# Patient Record
Sex: Male | Born: 1965 | Race: White | Hispanic: No | Marital: Married | Smoking: Never smoker
Health system: Southern US, Community
[De-identification: ages and names within clinical notes are randomized; demographics above are authoritative.]

## PROBLEM LIST (undated history)

## (undated) HISTORY — PX: APPENDECTOMY: SHX54

---

## 2009-06-02 ENCOUNTER — Emergency Department (HOSPITAL_COMMUNITY): Admission: EM | Admit: 2009-06-02 | Discharge: 2009-06-02 | Payer: Self-pay | Admitting: Emergency Medicine

## 2011-01-04 ENCOUNTER — Emergency Department (INDEPENDENT_AMBULATORY_CARE_PROVIDER_SITE_OTHER)
Admission: EM | Admit: 2011-01-04 | Discharge: 2011-01-04 | Disposition: A | Discharge status: Home / Self Care | Payer: PRIVATE HEALTH INSURANCE | Source: Home / Self Care | Attending: Family Medicine | Admitting: Family Medicine

## 2011-01-04 ENCOUNTER — Encounter: Payer: Self-pay | Admitting: *Deleted

## 2011-01-04 DIAGNOSIS — S335XXA Sprain of ligaments of lumbar spine, initial encounter: Secondary | ICD-10-CM

## 2011-01-04 DIAGNOSIS — S39012A Strain of muscle, fascia and tendon of lower back, initial encounter: Secondary | ICD-10-CM

## 2011-01-04 MED ORDER — CYCLOBENZAPRINE HCL 5 MG PO TABS: 5.0000 mg | ORAL_TABLET | Freq: Three times a day (TID) | ORAL | Status: AC | PRN

## 2011-01-04 MED ORDER — KETOROLAC TROMETHAMINE 30 MG/ML IJ SOLN
INTRAMUSCULAR | Status: AC
  Filled 2011-01-04: qty 1

## 2011-01-04 MED ORDER — KETOROLAC TROMETHAMINE 30 MG/ML IJ SOLN
30.0000 mg | Freq: Once | INTRAMUSCULAR | Status: AC
  Administered 2011-01-04: 30 mg via INTRAMUSCULAR

## 2011-01-04 MED ORDER — IBUPROFEN 800 MG PO TABS: 800.0000 mg | ORAL_TABLET | Freq: Three times a day (TID) | ORAL | Status: AC

## 2011-01-04 NOTE — ED Provider Notes (Signed)
History     CSN: [id number]  Arrival date & time 01/04/11  4098   First MD Initiated Contact with Patient 01/04/11 1916      No chief complaint on file.   (Consider location/radiation/quality/duration/timing/severity/associated sxs/prior treatment) Patient is a 45 y.o. male presenting with back pain. The history is provided by the patient.  Back Pain  This is a new problem. The current episode started more than 1 week ago. The problem occurs constantly. The problem has not changed since onset.The pain is present in the lumbar spine. The quality of the pain is described as shooting. Radiates to: radiates up left lumbar spine. The pain is moderate. The symptoms are aggravated by certain positions and bending. Pertinent negatives include no numbness, no abdominal pain, no bladder incontinence, no paresthesias, no tingling and no weakness. He has tried nothing for the symptoms. Risk factors: exercises reg, in excellent fitness shape, no prior back issues.    No past medical history on file.  No past surgical history on file.  No family history on file.  History  Substance Use Topics  . Smoking status: Not on file  . Smokeless tobacco: Not on file  . Alcohol Use: Not on file      Review of Systems  Constitutional: Negative.   Gastrointestinal: Negative.  Negative for abdominal pain.  Genitourinary: Negative.  Negative for bladder incontinence.  Musculoskeletal: Positive for back pain. Negative for gait problem.  Neurological: Negative for tingling, weakness, numbness and paresthesias.    Allergies  Review of patient's allergies indicates not on file.  Home Medications  No current outpatient prescriptions on file.  BP 132/90  Pulse 107  Temp 99.2 F (37.3 C)  Resp 20  SpO2 100%  Physical Exam  Nursing note and vitals reviewed. Constitutional: He is oriented to person, place, and time. He appears well-developed and well-nourished.  Abdominal: Soft. Bowel sounds are  normal.  Musculoskeletal: He exhibits tenderness. He exhibits no edema.       Back:  Neurological: He is alert and oriented to person, place, and time. He displays normal reflexes. He exhibits normal muscle tone.  Skin: Skin is warm and dry.    ED Course  Procedures (including critical care time)  Labs Reviewed - No data to display No results found.   No diagnosis found.    MDM          Barkley Bruns, MD 01/04/11 2016

## 2011-01-04 NOTE — ED Notes (Signed)
Pt   Reports   Low  Back  Pain    Lower  Back  Area   X  1  Week   denys  Any  specefic  Injury  Pain is  Worse on movement  And  posistion     denys  Any  Urinary  Symptoms

## 2011-02-01 ENCOUNTER — Other Ambulatory Visit: Payer: Self-pay | Admitting: *Deleted

## 2011-02-01 DIAGNOSIS — M5137 Other intervertebral disc degeneration, lumbosacral region: Secondary | ICD-10-CM

## 2011-02-01 DIAGNOSIS — M5030 Other cervical disc degeneration, unspecified cervical region: Secondary | ICD-10-CM

## 2011-02-11 ENCOUNTER — Inpatient Hospital Stay
Admission: RE | Admit: 2011-02-11 | Payer: PRIVATE HEALTH INSURANCE | Source: Ambulatory Visit | Attending: *Deleted | Admitting: *Deleted

## 2011-02-21 ENCOUNTER — Ambulatory Visit
Admission: RE | Admit: 2011-02-21 | Discharge: 2011-02-21 | Disposition: A | Discharge status: Home / Self Care | Payer: PRIVATE HEALTH INSURANCE | Source: Ambulatory Visit | Attending: *Deleted | Admitting: *Deleted

## 2011-02-21 DIAGNOSIS — M5137 Other intervertebral disc degeneration, lumbosacral region: Secondary | ICD-10-CM

## 2011-02-21 DIAGNOSIS — M5030 Other cervical disc degeneration, unspecified cervical region: Secondary | ICD-10-CM

## 2019-09-08 ENCOUNTER — Emergency Department (HOSPITAL_BASED_OUTPATIENT_CLINIC_OR_DEPARTMENT_OTHER)
Admission: EM | Admit: 2019-09-08 | Discharge: 2019-09-08 | Disposition: A | Discharge status: Home / Self Care | Payer: PRIVATE HEALTH INSURANCE | Attending: Emergency Medicine | Admitting: Emergency Medicine

## 2019-09-08 ENCOUNTER — Emergency Department (HOSPITAL_BASED_OUTPATIENT_CLINIC_OR_DEPARTMENT_OTHER): Payer: PRIVATE HEALTH INSURANCE

## 2019-09-08 ENCOUNTER — Encounter (HOSPITAL_BASED_OUTPATIENT_CLINIC_OR_DEPARTMENT_OTHER): Payer: Self-pay | Admitting: Emergency Medicine

## 2019-09-08 ENCOUNTER — Other Ambulatory Visit: Payer: Self-pay

## 2019-09-08 DIAGNOSIS — R0789 Other chest pain: Secondary | ICD-10-CM | POA: Insufficient documentation

## 2019-09-08 LAB — BASIC METABOLIC PANEL
Anion gap: 9 (ref 5–15)
BUN: 13 mg/dL (ref 6–20)
CO2: 26 mmol/L (ref 22–32)
Calcium: 9.5 mg/dL (ref 8.9–10.3)
Chloride: 99 mmol/L (ref 98–111)
Creatinine, Ser: 1.15 mg/dL (ref 0.61–1.24)
GFR calc Af Amer: >60 mL/min (ref >60)
GFR calc non Af Amer: >60 mL/min (ref >60)
Glucose, Bld: 120 mg/dL — ABNORMAL HIGH (ref 70–99)
Potassium: 4.2 mmol/L (ref 3.5–5.1)
Sodium: 134 mmol/L — ABNORMAL LOW (ref 135–145)

## 2019-09-08 LAB — CBC
HCT: 53.8 % — ABNORMAL HIGH (ref 39.0–52.0)
Hemoglobin: 18.3 g/dL — ABNORMAL HIGH (ref 13.0–17.0)
MCH: 29.8 pg (ref 26.0–34.0)
MCHC: 34 g/dL (ref 30.0–36.0)
MCV: 87.5 fL (ref 80.0–100.0)
Platelets: 251 10*3/uL (ref 150–400)
RBC: 6.15 MIL/uL — ABNORMAL HIGH (ref 4.22–5.81)
RDW: 12.4 % (ref 11.5–15.5)
WBC: 13.7 10*3/uL — ABNORMAL HIGH (ref 4.0–10.5)
nRBC: 0 % (ref 0.0–0.2)

## 2019-09-08 LAB — TROPONIN I (HIGH SENSITIVITY)
Troponin I (High Sensitivity): 5 ng/L (ref <18)
Troponin I (High Sensitivity): 6 ng/L (ref <18)

## 2019-09-08 LAB — D-DIMER, QUANTITATIVE: D-Dimer, Quant: 0.79 ug/mL-FEU — ABNORMAL HIGH (ref 0.00–0.50)

## 2019-09-08 MED ORDER — ALUM & MAG HYDROXIDE-SIMETH 200-200-20 MG/5ML PO SUSP
30.0000 mL | Freq: Once | ORAL | Status: AC
  Administered 2019-09-08: 30 mL via ORAL
  Filled 2019-09-08: qty 30

## 2019-09-08 MED ORDER — PANTOPRAZOLE SODIUM 40 MG IV SOLR
40.0000 mg | Freq: Once | INTRAVENOUS | Status: AC
Start: 2019-09-08 — End: 2019-09-08
  Administered 2019-09-08: 40 mg via INTRAVENOUS
  Filled 2019-09-08: qty 40

## 2019-09-08 MED ORDER — FAMOTIDINE IN NACL 20-0.9 MG/50ML-% IV SOLN
20.0000 mg | Freq: Once | INTRAVENOUS | Status: AC
  Administered 2019-09-08: 20 mg via INTRAVENOUS
  Filled 2019-09-08: qty 50

## 2019-09-08 MED ORDER — PANTOPRAZOLE SODIUM 20 MG PO TBEC: 20.0000 mg | DELAYED_RELEASE_TABLET | Freq: Every day | ORAL | 0 refills | Status: AC

## 2019-09-08 MED ORDER — FAMOTIDINE 20 MG PO TABS: 20.0000 mg | ORAL_TABLET | Freq: Every day | ORAL | 0 refills | Status: AC

## 2019-09-08 MED ORDER — SODIUM CHLORIDE 0.9 % IV BOLUS
1000.0000 mL | Freq: Once | INTRAVENOUS | Status: AC
  Administered 2019-09-08: 1000 mL via INTRAVENOUS

## 2019-09-08 MED ORDER — SUCRALFATE 1 GM/10ML PO SUSP: 1.0000 g | Freq: Three times a day (TID) | ORAL | 0 refills | Status: AC

## 2019-09-08 MED ORDER — IOHEXOL 350 MG/ML SOLN
100.0000 mL | Freq: Once | INTRAVENOUS | Status: AC | PRN
  Administered 2019-09-08: 89 mL via INTRAVENOUS

## 2019-09-08 MED ORDER — LIDOCAINE VISCOUS HCL 2 % MT SOLN
15.0000 mL | Freq: Once | OROMUCOSAL | Status: AC
  Administered 2019-09-08: 15 mL via ORAL
  Filled 2019-09-08: qty 15

## 2019-09-08 NOTE — ED Provider Notes (Signed)
[facility] EMERGENCY DEPARTMENT Provider Note   CSN: [id number] Arrival date & time: 09/08/19  0551     History Chief Complaint  Patient presents with  . Chest Pain    Levi Clark is a 54 y.o. male history of chronic back pain on Suboxone.  Otherwise healthy no other daily medication use.  Patient presents today for chest pain onset 4 AM while sleeping, woke him up from bed.  Described as a knot in the center of his chest moderate intensity waxes and wanes no clear inciting factor, no radiation of pain.  Pain occasionally worsened with deep inspiration which causes shortness of breath, at other times no shortness of breath associated.  Patient denies similar pain in the past, pain not worsened with exertion.  Patient reports that he had a long evening yesterday with his wife, they went to a class, he did not drink much water.  He got home around 1030-11 PM and 8 large amount of pizza before going directly to bed.  He reports this is not his normal routine and he never eats that late. He took one Omeprazole today without relief of symptoms.  Patient denies fever/chill, fall/injury, headache, vision changes, neck pain, cough/hemoptysis, abdominal pain, nausea/vomiting, diaphoresis, extremity swelling/color change, numbness/weakness, tingling or any additional concerns.  Patient reports that his father who is a heavy smoker had a stent placed in his mid to late 25s.  Patient denies personal history of heart disease.  He denies history of hypertension, high cholesterol, diabetes, history of smoking, CVA/TIA or PAD.  Additionally patient denies history of blood clot, recent immobilization/surgery, hemoptysis, cancer or hormone use.  HPI     History reviewed. No pertinent past medical history.  There are no problems to display for this patient.   Past Surgical History:  Procedure Laterality Date  . APPENDECTOMY         Family History  Problem Relation Age of Onset   . Emphysema Mother   . CAD Father   . Leukemia Father   . Lung cancer Father     Social History   Tobacco Use  . Smoking status: Never Smoker  . Smokeless tobacco: Never Used  Vaping Use  . Vaping Use: Never used  Substance Use Topics  . Alcohol use: Never  . Drug use: Never    Home Medications Prior to Admission medications   Medication Sig Start Date End Date Taking? Authorizing Provider  aspirin 325 MG tablet Take 325 mg by mouth daily.      [provider]  famotidine (PEPCID) 20 MG tablet Take 1 tablet (20 mg total) by mouth daily. 09/08/19   Harlene Salts A, PA-C  pantoprazole (PROTONIX) 20 MG tablet Take 1 tablet (20 mg total) by mouth daily. 09/08/19   Harlene Salts A, PA-C  sucralfate (CARAFATE) 1 GM/10ML suspension Take 10 mLs (1 g total) by mouth 4 (four) times daily -  with meals and at bedtime. 09/08/19   Bill Salinas, PA-C    Allergies    Patient has no known allergies.  Review of Systems   Review of Systems Ten systems are reviewed and are negative for acute change except as noted in the HPI  Physical Exam Updated Vital Signs BP 140/80   Pulse 92   Temp 98.4 F (36.9 C) (Oral)   Resp 13   Ht 6\' 2"  (1.88 m)   Wt 111.1 kg   SpO2 98%   BMI 31.46 kg/m   Physical Exam Constitutional:  General: He is not in acute distress.    Appearance: Normal appearance. He is well-developed. He is not ill-appearing or diaphoretic.  HENT:     Head: Normocephalic and atraumatic.  Eyes:     General: Vision grossly intact. Gaze aligned appropriately.     Pupils: Pupils are equal, round, and reactive to light.  Neck:     Trachea: Trachea and phonation normal.  Cardiovascular:     Rate and Rhythm: Normal rate and regular rhythm.     Pulses:          Radial pulses are 2+ on the right side and 2+ on the left side.       Posterior tibial pulses are 2+ on the right side and 2+ on the left side.  Pulmonary:     Effort: Pulmonary effort is normal. No  respiratory distress.     Breath sounds: Normal breath sounds.  Abdominal:     General: There is no distension.     Palpations: Abdomen is soft.     Tenderness: There is no abdominal tenderness. There is no guarding or rebound.  Musculoskeletal:        General: Normal range of motion.     Cervical back: Normal range of motion.     Right lower leg: No tenderness. No edema.     Left lower leg: No tenderness. No edema.  Skin:    General: Skin is warm and dry.  Neurological:     Mental Status: He is alert.     GCS: GCS eye subscore is 4. GCS verbal subscore is 5. GCS motor subscore is 6.     Comments: Speech is clear and goal oriented, follows commands Major Cranial nerves without deficit, no facial droop Moves extremities without ataxia, coordination intact  Psychiatric:        Behavior: Behavior normal.     ED Results / Procedures / Treatments   Labs (all labs ordered are listed, but only abnormal results are displayed) Labs Reviewed  BASIC METABOLIC PANEL - Abnormal; Notable for the following components:      Result Value   Sodium 134 (*)    Glucose, Bld 120 (*)    All other components within normal limits  CBC - Abnormal; Notable for the following components:   WBC 13.7 (*)    RBC 6.15 (*)    Hemoglobin 18.3 (*)    HCT 53.8 (*)    All other components within normal limits  D-DIMER, QUANTITATIVE (NOT AT [facility]) - Abnormal; Notable for the following components:   D-Dimer, Quant 0.79 (*)    All other components within normal limits  TROPONIN I (HIGH SENSITIVITY)  TROPONIN I (HIGH SENSITIVITY)    EKG EKG Interpretation  Date/Time:  Saturday September 08 2019 06:30:52 EDT Ventricular Rate:  109 PR Interval:  130 QRS Duration: 94 QT Interval:  320 QTC Calculation: 430 R Axis:   67 Text Interpretation: Sinus tachycardia Otherwise normal ECG No previous ECGs available Confirmed by Paula LibraMolpus, John ([id number]) on 09/08/2019 6:51:42 AM   Radiology DG Chest 2 View  Result Date:  09/08/2019 CLINICAL DATA:  Chest pain EXAM: CHEST - 2 VIEW COMPARISON:  None. FINDINGS: Heart size and mediastinal contours are within normal limits. Lungs are clear. No pleural effusion or pneumothorax is seen. Mild degenerative spondylosis of the thoracic spine. No acute or suspicious osseous finding. IMPRESSION: No active cardiopulmonary disease. No evidence of pneumonia or pulmonary edema. Electronically Signed   By: Anne NgStan  Maynard M.D.  On: 09/08/2019 07:08   CT Angio Chest PE W and/or Wo Contrast  Addendum Date: 09/08/2019   ADDENDUM REPORT: 09/08/2019 14:29 ADDENDUM: Ordering physician tells me that patient recently ingested a GI cocktail with Maalox containing aluminum hydroxide. This corresponds to the hyperdense material seen in the stomach. Electronically Signed   By: Bary Richard M.D.   On: 09/08/2019 14:29   Result Date: 09/08/2019 CLINICAL DATA:  Mid chest pain starting 12 hours ago. Elevated D-dimer. EXAM: CT ANGIOGRAPHY CHEST WITH CONTRAST TECHNIQUE: Multidetector CT imaging of the chest was performed using the standard protocol during bolus administration of intravenous contrast. Multiplanar CT image reconstructions and MIPs were obtained to evaluate the vascular anatomy. CONTRAST:  57mL OMNIPAQUE IOHEXOL 350 MG/ML SOLN COMPARISON:  None. FINDINGS: Cardiovascular: Suboptimal contrast bolus timing slightly limits characterization of the bilateral pulmonary artery branches but there is no pulmonary embolism identified within the main, lobar or central segmental pulmonary arteries bilaterally. No thoracic aortic aneurysm or evidence of aortic dissection. Heart size is within normal limits. No pericardial effusion. Focal coronary artery calcifications near the junction of the LAD and LEFT circumflex coronary arteries. Mediastinum/Nodes: No mass or enlarged lymph nodes are seen within the mediastinum or perihilar regions. Esophagus is unremarkable. Trachea and central bronchi are unremarkable.  Lungs/Pleura: Lungs are clear.  No pleural effusion or pneumothorax. Upper Abdomen: Limited images of the upper abdomen are unremarkable. Hyperdense material is present within the gastric fundus/cardia, of uncertain etiology. Musculoskeletal: Mild degenerative spurring within the kyphotic thoracic spine. No acute appearing osseous abnormality. Review of the MIP images confirms the above findings. IMPRESSION: 1. No acute findings. No pulmonary embolism seen, with mild study limitations detailed above. Lungs are clear. 2. Coronary artery calcification. 3. Hyperdense material within the gastric fundus/cardia, of uncertain etiology, presumably due to recently ingested material, but active hemorrhage within the stomach cannot be excluded. Has patient recently ingested Pepto-Bismol or other dense material? Electronically Signed: By: Bary Richard M.D. On: 09/08/2019 13:29    Procedures Procedures (including critical care time)  Medications Ordered in ED Medications  sodium chloride 0.9 % bolus 1,000 mL ( Intravenous Stopped 09/08/19 1407)  pantoprazole (PROTONIX) injection 40 mg (40 mg Intravenous Given 09/08/19 1248)  famotidine (PEPCID) IVPB 20 mg premix ( Intravenous Stopped 09/08/19 1350)  alum & mag hydroxide-simeth (MAALOX/MYLANTA) 200-200-20 MG/5ML suspension 30 mL (30 mLs Oral Given 09/08/19 1241)    And  lidocaine (XYLOCAINE) 2 % viscous mouth solution 15 mL (15 mLs Oral Given 09/08/19 1241)  iohexol (OMNIPAQUE) 350 MG/ML injection 100 mL (89 mLs Intravenous Contrast Given 09/08/19 1258)    ED Course  I have reviewed the triage vital signs and the nursing notes.  Pertinent labs & imaging results that were available during my care of the patient were reviewed by me and considered in my medical decision making (see chart for details).  Clinical Course as of Sep 08 1439  Sat Sep 08, 2019  1341 Dr. Linde Gillis   [BM]    Clinical Course User Index [BM] Elizabeth Palau   MDM  Rules/Calculators/A&P                          Additional history obtained from: 1. Nursing notes from this visit. 2. Family, wife at bedside. 3. Electronic Medical Record. --------------------------- I ordered, reviewed and interpreted labs which include: CBC shows leukocystosis of 13.7 and elevated hemoglobin of 18.3. Possible hemoconcentration from dehydration last night?  Patient given  fluid bolus.  Will encourage patient to recheck CBC at PCPs office.  He has no symptoms to suggest infection. BMP shows no emergent electrolyte derangement, AKI or gap. Initial and delta high-sensitivity troponins within normal limits 5->6 D-dimer elevated at 0.79, CT angio chest ordered.  EKG: Sinus tachycardia Otherwise normal ECG No previous ECGs available Confirmed by Molpus, John ([id number]) on 09/08/2019 6:51:42 AM  CXR:  IMPRESSION:  No active cardiopulmonary disease. No evidence of pneumonia or  pulmonary edema.   CTA Chest PE Study:  IMPRESSION:  1. No acute findings. No pulmonary embolism seen, with mild study  limitations detailed above. Lungs are clear.  2. Coronary artery calcification.  3. Hyperdense material within the gastric fundus/cardia, of  uncertain etiology, presumably due to recently ingested material,  but active hemorrhage within the stomach cannot be excluded. Has  patient recently ingested Pepto-Bismol or other dense material?   1:41 PM: Spoke with radiologist Dr. Linde Gillis regarding imaging above and case.  Patient had received GI cocktail around 12:41 PM, just prior to undergoing CT scanning.  Dr. Linde Gillis advises that hyperdense material seen is adequately explained by GI cocktail.  Patient also has no left upper quadrant pain, low suspicion for gastric hemorrhage at this time.  Suspect patient symptoms today secondary to GERD and eating the pizza late last night.  Will start patient on Protonix, Pepcid, Carafate and referred to GI and PCP for follow-up. Heart score less than  4.  Doubt ACS, PE, dissection, pneumonia, anemia or other emergent cardiopulmonary etiologies at this time.  Additionally no abdominal pain to suggest cholecystitis, perforation, SBO, pancreatitis or other emergent abdominal pelvic etiologies.  We discussed all incidental findings including coronary artery calcifications and hyperdense materials in the stomach and patient stated understanding.  He was given referral to cardiology and gastroenterology for follow-ups.  Patient is going to start taking 81 mg baby aspirin daily.   At this time there does not appear to be any evidence of an acute emergency medical condition and the patient appears stable for discharge with appropriate outpatient follow up. Diagnosis was discussed with patient who verbalizes understanding of care plan and is agreeable to discharge. I have discussed return precautions with patient and who verbalizes understanding. Patient encouraged to follow-up with their PCP, GI, cardiology. All questions answered.  Patient's case discussed with Dr. Dalene Seltzer who agrees with plan to discharge with above medications and outpatient cardiology/GI follow-ups.  Note: Portions of this report may have been transcribed using voice recognition software. Every effort was made to ensure accuracy; however, inadvertent computerized transcription errors may still be present. Final Clinical Impression(s) / ED Diagnoses Final diagnoses:  Atypical chest pain    Rx / DC Orders ED Discharge Orders         Ordered    pantoprazole (PROTONIX) 20 MG tablet  Daily        09/08/19 1439    famotidine (PEPCID) 20 MG tablet  Daily        09/08/19 1439    sucralfate (CARAFATE) 1 GM/10ML suspension  3 times daily with meals & bedtime        09/08/19 1439    Ambulatory referral to Cardiology        09/08/19 1439           Elizabeth Palau 09/08/19 1442    Alvira Monday, MD 09/10/19 1036

## 2019-09-08 NOTE — ED Notes (Signed)
Patient transported to CT 

## 2019-09-08 NOTE — Discharge Instructions (Addendum)
At this time there does not appear to be the presence of an emergent medical condition, however there is always the potential for conditions to change. Please read and follow the below instructions.  Please return to the Emergency Department immediately for any new or worsening symptoms. Please be sure to follow up with your Primary Care Provider within one week regarding your visit today; please call their office to schedule an appointment even if you are feeling better for a follow-up visit. Your CT scan showed calcifications of your coronary arteries.  Please follow-up with the cardiologist on your discharge paperwork for further evaluation.  Please begin taking a baby aspirin 81 mg daily to help lower your risk of future heart disease. There is a possibility that your symptoms today are related to heartburn.  Please take the medications Protonix, Pepcid and Carafate as prescribed.  Protonix will replace your medication omeprazole.  Please avoid spicy foods and eating late at night.  Please drink plenty water and get plenty of rest.  You have also been given a referral to Dr. Bosie Clos, a gastroenterologist for further evaluation regarding your pain today. Please have your primary care doctor recheck your blood counts at your follow-up appointment, your white blood cell count and your hemoglobin counts were high today this could be due to dehydration but recheck at your primary care doctor is advised to ensure improvement.  Get help right away if: Your chest pain is worse. You have a cough that gets worse, or you cough up blood. You have very bad (severe) pain in your belly (abdomen). You pass out (faint). You have either of these for no clear reason: Sudden chest discomfort. Sudden discomfort in your arms, back, neck, or jaw. You have shortness of breath at any time. You suddenly start to sweat, or your skin gets clammy. You feel sick to your stomach (nauseous). You throw up (vomit). You  suddenly feel lightheaded or dizzy. You feel very weak or tired. Your heart starts to beat fast, or it feels like it is skipping beats. You have any new/concerning or worsening of symptoms These symptoms may be an emergency. Do not wait to see if the symptoms will go away. Get medical help right away. Call your local emergency services (911 in the [place] Do not drive yourself to the hospital.  Please read the additional information packets attached to your discharge summary.  Do not take your medicine if  develop an itchy rash, swelling in your mouth or lips, or difficulty breathing; call 911 and seek immediate emergency medical attention if this occurs.  You may review your lab tests and imaging results in their entirety on your MyChart account.  Please discuss all results of fully with your primary care provider and other specialist at your follow-up visit.  Note: Portions of this text may have been transcribed using voice recognition software. Every effort was made to ensure accuracy; however, inadvertent computerized transcription errors may still be present.

## 2019-09-08 NOTE — ED Triage Notes (Signed)
Pt states he woke up this morning about 4 am with chest pain  Pt states the pain has progressively gotten worse  Pt states he feels like he has a knot in his chest  Pt states he feels short of breath   Pt states he ate frozen pizza last night prior to going to bed

## 2019-09-16 NOTE — Progress Notes (Deleted)
Cardiology Office Note:   Date:  09/16/2019  NAME:  Levi Clark    MRN: [id number] DOB:  Apr 29, 1965   PCP:  Patient, No Pcp Per  Cardiologist:  No primary care provider on file.  Electrophysiologist:  None   Referring MD: Bill Salinas, PA-C   No chief complaint on file. ***  History of Present Illness:   Levi Clark is a 54 y.o. male with a hx of chronic pain on suboxone who is being seen today for the evaluation of chest pain at the request of Bill Salinas, PA-C. Evaluated in the ER 9/4 for atypical CP. Troponin negative x 2. Minimal coronary calcifications noted on that scan which I personally reviewed.   Past Medical History: No past medical history on file.  Past Surgical History: Past Surgical History:  Procedure Laterality Date  . APPENDECTOMY      Current Medications: No outpatient medications have been marked as taking for the 09/18/19 encounter (Appointment) with O'Neal, Ronnald Ramp, MD.     Allergies:    Patient has no known allergies.   Social History: Social History   Socioeconomic History  . Marital status: Married    Spouse name: Not on file  . Number of children: Not on file  . Years of education: Not on file  . Highest education level: Not on file  Occupational History  . Not on file  Tobacco Use  . Smoking status: Never Smoker  . Smokeless tobacco: Never Used  Vaping Use  . Vaping Use: Never used  Substance and Sexual Activity  . Alcohol use: Never  . Drug use: Never  . Sexual activity: Not on file  Other Topics Concern  . Not on file  Social History Narrative  . Not on file   Social Determinants of Health   Financial Resource Strain:   . Difficulty of Paying Living Expenses: Not on file  Food Insecurity:   . Worried About Programme researcher, broadcasting/film/video in the Last Year: Not on file  . Ran Out of Food in the Last Year: Not on file  Transportation Needs:   . Lack of Transportation (Medical): Not on file  . Lack of  Transportation (Non-Medical): Not on file  Physical Activity:   . Days of Exercise per Week: Not on file  . Minutes of Exercise per Session: Not on file  Stress:   . Feeling of Stress : Not on file  Social Connections:   . Frequency of Communication with Friends and Family: Not on file  . Frequency of Social Gatherings with Friends and Family: Not on file  . Attends Religious Services: Not on file  . Active Member of Clubs or Organizations: Not on file  . Attends Banker Meetings: Not on file  . Marital Status: Not on file     Family History: The patient's ***family history includes CAD in his father; Emphysema in his mother; Leukemia in his father; Lung cancer in his father.  ROS:   All other ROS reviewed and negative. Pertinent positives noted in the HPI.     EKGs/Labs/Other Studies Reviewed:   The following studies were personally reviewed by me today:  EKG:  EKG is *** ordered today.  The ekg ordered today demonstrates ***, and was personally reviewed by me.   Recent Labs: 09/08/2019: BUN 13; Creatinine, Ser 1.15; Hemoglobin 18.3; Platelets 251; Potassium 4.2; Sodium 134   Recent Lipid Panel No results found for: CHOL, TRIG, HDL, CHOLHDL, VLDL, LDLCALC, LDLDIRECT  Physical Exam:   VS:  There were no vitals taken for this visit.   Wt Readings from Last 3 Encounters:  09/08/19 245 lb (111.1 kg)    General: Well nourished, well developed, in no acute distress Heart: Atraumatic, normal size  Eyes: PEERLA, EOMI  Neck: Supple, no JVD Endocrine: No thryomegaly Cardiac: Normal S1, S2; RRR; no murmurs, rubs, or gallops Lungs: Clear to auscultation bilaterally, no wheezing, rhonchi or rales  Abd: Soft, nontender, no hepatomegaly  Ext: No edema, pulses 2+ Musculoskeletal: No deformities, BUE and BLE strength normal and equal Skin: Warm and dry, no rashes   Neuro: Alert and oriented to person, place, time, and situation, CNII-XII grossly intact, no focal deficits    Psych: Normal mood and affect   ASSESSMENT:   Levi Clark is a 54 y.o. male who presents for the following: No diagnosis found.  PLAN:   There are no diagnoses linked to this encounter.  Disposition: No follow-ups on file.  Medication Adjustments/Labs and Tests Ordered: Current medicines are reviewed at length with the patient today.  Concerns regarding medicines are outlined above.  No orders of the defined types were placed in this encounter.  No orders of the defined types were placed in this encounter.   There are no Patient Instructions on file for this visit.   Time Spent with Patient: I have spent a total of *** minutes with patient reviewing hospital notes, telemetry, EKGs, labs and examining the patient as well as establishing an assessment and plan that was discussed with the patient.  > 50% of time was spent in direct patient care.  Signed, Lenna Gilford. Flora Lipps, MD [facility]  [address] [place], [place] [address] [phone]  09/16/2019 7:02 PM

## 2019-09-18 ENCOUNTER — Ambulatory Visit: Payer: PRIVATE HEALTH INSURANCE | Admitting: Cardiovascular Disease

## 2019-09-24 ENCOUNTER — Encounter: Payer: Self-pay | Admitting: Gastroenterology

## 2019-10-30 ENCOUNTER — Ambulatory Visit: Payer: PRIVATE HEALTH INSURANCE | Admitting: Gastroenterology

## 2019-11-06 ENCOUNTER — Ambulatory Visit: Payer: PRIVATE HEALTH INSURANCE | Admitting: Gastroenterology

## 2020-01-28 DIAGNOSIS — Z125 Encounter for screening for malignant neoplasm of prostate: Secondary | ICD-10-CM | POA: Diagnosis not present

## 2020-01-28 DIAGNOSIS — I4891 Unspecified atrial fibrillation: Secondary | ICD-10-CM | POA: Diagnosis not present

## 2020-01-28 DIAGNOSIS — R0683 Snoring: Secondary | ICD-10-CM | POA: Diagnosis not present

## 2020-01-28 DIAGNOSIS — N5201 Erectile dysfunction due to arterial insufficiency: Secondary | ICD-10-CM | POA: Diagnosis not present

## 2020-01-28 DIAGNOSIS — Z13220 Encounter for screening for lipoid disorders: Secondary | ICD-10-CM | POA: Diagnosis not present

## 2020-01-28 DIAGNOSIS — K219 Gastro-esophageal reflux disease without esophagitis: Secondary | ICD-10-CM | POA: Diagnosis not present

## 2020-01-28 DIAGNOSIS — Z6831 Body mass index (BMI) 31.0-31.9, adult: Secondary | ICD-10-CM | POA: Diagnosis not present

## 2020-01-28 DIAGNOSIS — Z1329 Encounter for screening for other suspected endocrine disorder: Secondary | ICD-10-CM | POA: Diagnosis not present

## 2020-01-28 DIAGNOSIS — Z131 Encounter for screening for diabetes mellitus: Secondary | ICD-10-CM | POA: Diagnosis not present

## 2020-01-28 DIAGNOSIS — I10 Essential (primary) hypertension: Secondary | ICD-10-CM | POA: Diagnosis not present

## 2020-01-28 DIAGNOSIS — E6609 Other obesity due to excess calories: Secondary | ICD-10-CM | POA: Diagnosis not present

## 2020-08-12 DIAGNOSIS — I4891 Unspecified atrial fibrillation: Secondary | ICD-10-CM | POA: Diagnosis not present

## 2020-08-12 DIAGNOSIS — I10 Essential (primary) hypertension: Secondary | ICD-10-CM | POA: Diagnosis not present

## 2020-08-12 DIAGNOSIS — E786 Lipoprotein deficiency: Secondary | ICD-10-CM | POA: Diagnosis not present

## 2020-08-12 DIAGNOSIS — J301 Allergic rhinitis due to pollen: Secondary | ICD-10-CM | POA: Diagnosis not present

## 2020-08-12 DIAGNOSIS — R0683 Snoring: Secondary | ICD-10-CM | POA: Diagnosis not present

## 2020-08-12 DIAGNOSIS — R7303 Prediabetes: Secondary | ICD-10-CM | POA: Diagnosis not present

## 2020-08-12 DIAGNOSIS — K219 Gastro-esophageal reflux disease without esophagitis: Secondary | ICD-10-CM | POA: Diagnosis not present

## 2020-08-12 DIAGNOSIS — N5201 Erectile dysfunction due to arterial insufficiency: Secondary | ICD-10-CM | POA: Diagnosis not present

## 2021-01-18 DIAGNOSIS — U071 COVID-19: Secondary | ICD-10-CM | POA: Diagnosis not present

## 2021-01-18 DIAGNOSIS — B349 Viral infection, unspecified: Secondary | ICD-10-CM | POA: Diagnosis not present

## 2021-01-18 DIAGNOSIS — R059 Cough, unspecified: Secondary | ICD-10-CM | POA: Diagnosis not present

## 2021-04-14 DIAGNOSIS — F4320 Adjustment disorder, unspecified: Secondary | ICD-10-CM | POA: Diagnosis not present

## 2021-04-28 DIAGNOSIS — F4320 Adjustment disorder, unspecified: Secondary | ICD-10-CM | POA: Diagnosis not present

## 2021-05-05 DIAGNOSIS — F4320 Adjustment disorder, unspecified: Secondary | ICD-10-CM | POA: Diagnosis not present

## 2021-05-19 DIAGNOSIS — F4320 Adjustment disorder, unspecified: Secondary | ICD-10-CM | POA: Diagnosis not present

## 2021-05-26 DIAGNOSIS — F4320 Adjustment disorder, unspecified: Secondary | ICD-10-CM | POA: Diagnosis not present

## 2021-06-09 DIAGNOSIS — F4320 Adjustment disorder, unspecified: Secondary | ICD-10-CM | POA: Diagnosis not present

## 2021-06-23 DIAGNOSIS — F4320 Adjustment disorder, unspecified: Secondary | ICD-10-CM | POA: Diagnosis not present

## 2021-06-29 DIAGNOSIS — R0683 Snoring: Secondary | ICD-10-CM | POA: Diagnosis not present

## 2021-06-29 DIAGNOSIS — R7303 Prediabetes: Secondary | ICD-10-CM | POA: Diagnosis not present

## 2021-06-29 DIAGNOSIS — Z0000 Encounter for general adult medical examination without abnormal findings: Secondary | ICD-10-CM | POA: Diagnosis not present

## 2021-06-29 DIAGNOSIS — I10 Essential (primary) hypertension: Secondary | ICD-10-CM | POA: Diagnosis not present

## 2021-06-29 DIAGNOSIS — I4891 Unspecified atrial fibrillation: Secondary | ICD-10-CM | POA: Diagnosis not present

## 2022-07-09 ENCOUNTER — Ambulatory Visit
Admission: EM | Admit: 2022-07-09 | Discharge: 2022-07-09 | Disposition: A | Discharge status: Home / Self Care | Payer: BC Managed Care – PPO | Attending: Urgent Care | Admitting: Urgent Care

## 2022-07-09 DIAGNOSIS — H6123 Impacted cerumen, bilateral: Secondary | ICD-10-CM | POA: Diagnosis not present

## 2022-07-09 MED ORDER — CARBAMIDE PEROXIDE 6.5 % OT SOLN: 5.0000 [drp] | Freq: Every day | OTIC | 0 refills | Status: AC | PRN

## 2022-07-09 NOTE — ED Triage Notes (Signed)
Pt present right ear fullness with pressure, symptoms started this am. Pt states that it feels like his right ear is closed up and he brought a ear kit for relieve but same it got worst.

## 2022-07-09 NOTE — ED Provider Notes (Signed)
[facility]  Note:  This document was prepared using Conservation officer, historic buildings and may include unintentional dictation errors.  MRN: [id number] DOB: 05-08-1965  Subjective:   Levi Clark is a 57 y.o. male presenting for acute onset of decreased hearing and fullness of the right ear. Has used an earwax solution kit for both ears but the right is more bothersome.  No fever, drainage of pus or bleeding.  No sinus symptoms.  Patient has used Q-tips regularly in the past.  No current facility-administered medications for this encounter.  Current Outpatient Medications:    aspirin 325 MG tablet, Take 325 mg by mouth daily.  , Disp: , Rfl:    famotidine (PEPCID) 20 MG tablet, Take 1 tablet (20 mg total) by mouth daily., Disp: 30 tablet, Rfl: 0   pantoprazole (PROTONIX) 20 MG tablet, Take 1 tablet (20 mg total) by mouth daily., Disp: 30 tablet, Rfl: 0   sucralfate (CARAFATE) 1 GM/10ML suspension, Take 10 mLs (1 g total) by mouth 4 (four) times daily -  with meals and at bedtime., Disp: 420 mL, Rfl: 0   Allergies  Allergen Reactions   Other Other (See Comments)    History reviewed. No pertinent past medical history.   Past Surgical History:  Procedure Laterality Date   APPENDECTOMY      Family History  Problem Relation Age of Onset   Emphysema Mother    CAD Father    Leukemia Father    Lung cancer Father     Social History   Tobacco Use   Smoking status: Never   Smokeless tobacco: Never  Vaping Use   Vaping Use: Never used  Substance Use Topics   Alcohol use: Never   Drug use: Never    ROS   Objective:   Vitals: BP 137/88 (BP Location: Right Arm)   Pulse 96   Temp 98.2 F (36.8 C) (Oral)   Resp 18   SpO2 95%   Physical Exam Constitutional:      General: He is not in acute distress.    Appearance: Normal appearance. He is well-developed and normal weight. He is not ill-appearing, toxic-appearing or diaphoretic.  HENT:      Head: Normocephalic and atraumatic.     Right Ear: Tympanic membrane, ear canal and external ear normal. There is impacted cerumen.     Left Ear: Tympanic membrane, ear canal and external ear normal. There is impacted cerumen.     Nose: Nose normal.     Mouth/Throat:     Pharynx: Oropharynx is clear.  Eyes:     General: No scleral icterus.       Right eye: No discharge.        Left eye: No discharge.     Extraocular Movements: Extraocular movements intact.  Cardiovascular:     Rate and Rhythm: Normal rate.  Pulmonary:     Effort: Pulmonary effort is normal.  Musculoskeletal:     Cervical back: Normal range of motion.  Neurological:     Mental Status: He is alert and oriented to person, place, and time.  Psychiatric:        Mood and Affect: Mood normal.        Behavior: Behavior normal.        Thought Content: Thought content normal.        Judgment: Judgment normal.    Ear lavage performed using mixture of peroxide and water.  Pressure irrigation performed using a bottle and  a thin ear tube.  Bilateral ear lavage.  Curette was used for manual removal.   Assessment and Plan :   PDMP not reviewed this encounter.  1. Bilateral impacted cerumen    Successful bilateral ear lavage.  General management of cerumen impaction reviewed with patient.  Anticipatory guidance provided. Counseled patient on potential for adverse effects with medications prescribed/recommended today, ER and return-to-clinic precautions discussed, patient verbalized understanding.    Wallis Bamberg, PA-C 07/09/22 1023

## 2022-08-25 IMAGING — CR DG CHEST 2V
2 series · 2 of 2 positions shown · non-contrast
Comparison: None.

CLINICAL DATA: Chest pain

EXAM:
CHEST - 2 VIEW

[w chest pa]
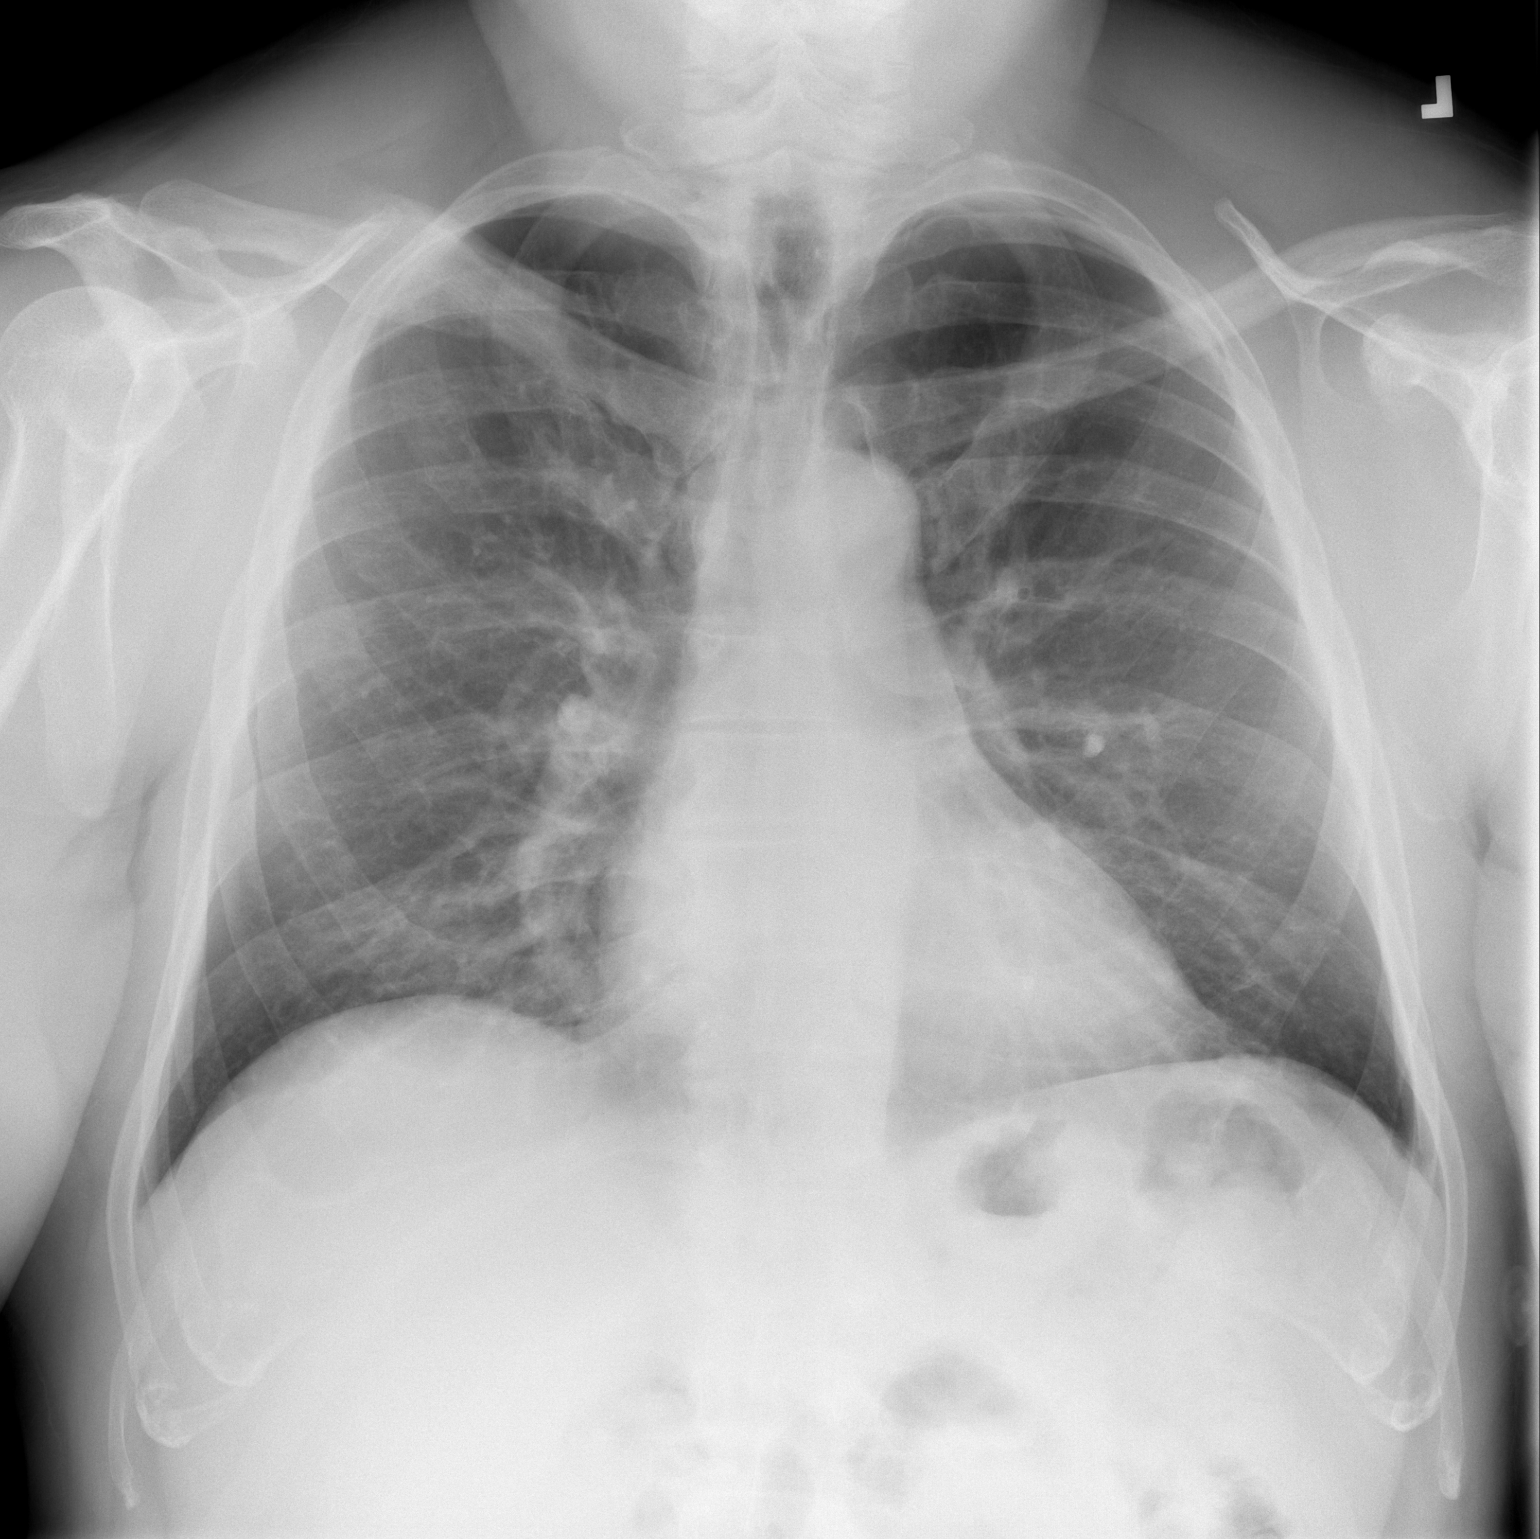

[w chest lat]
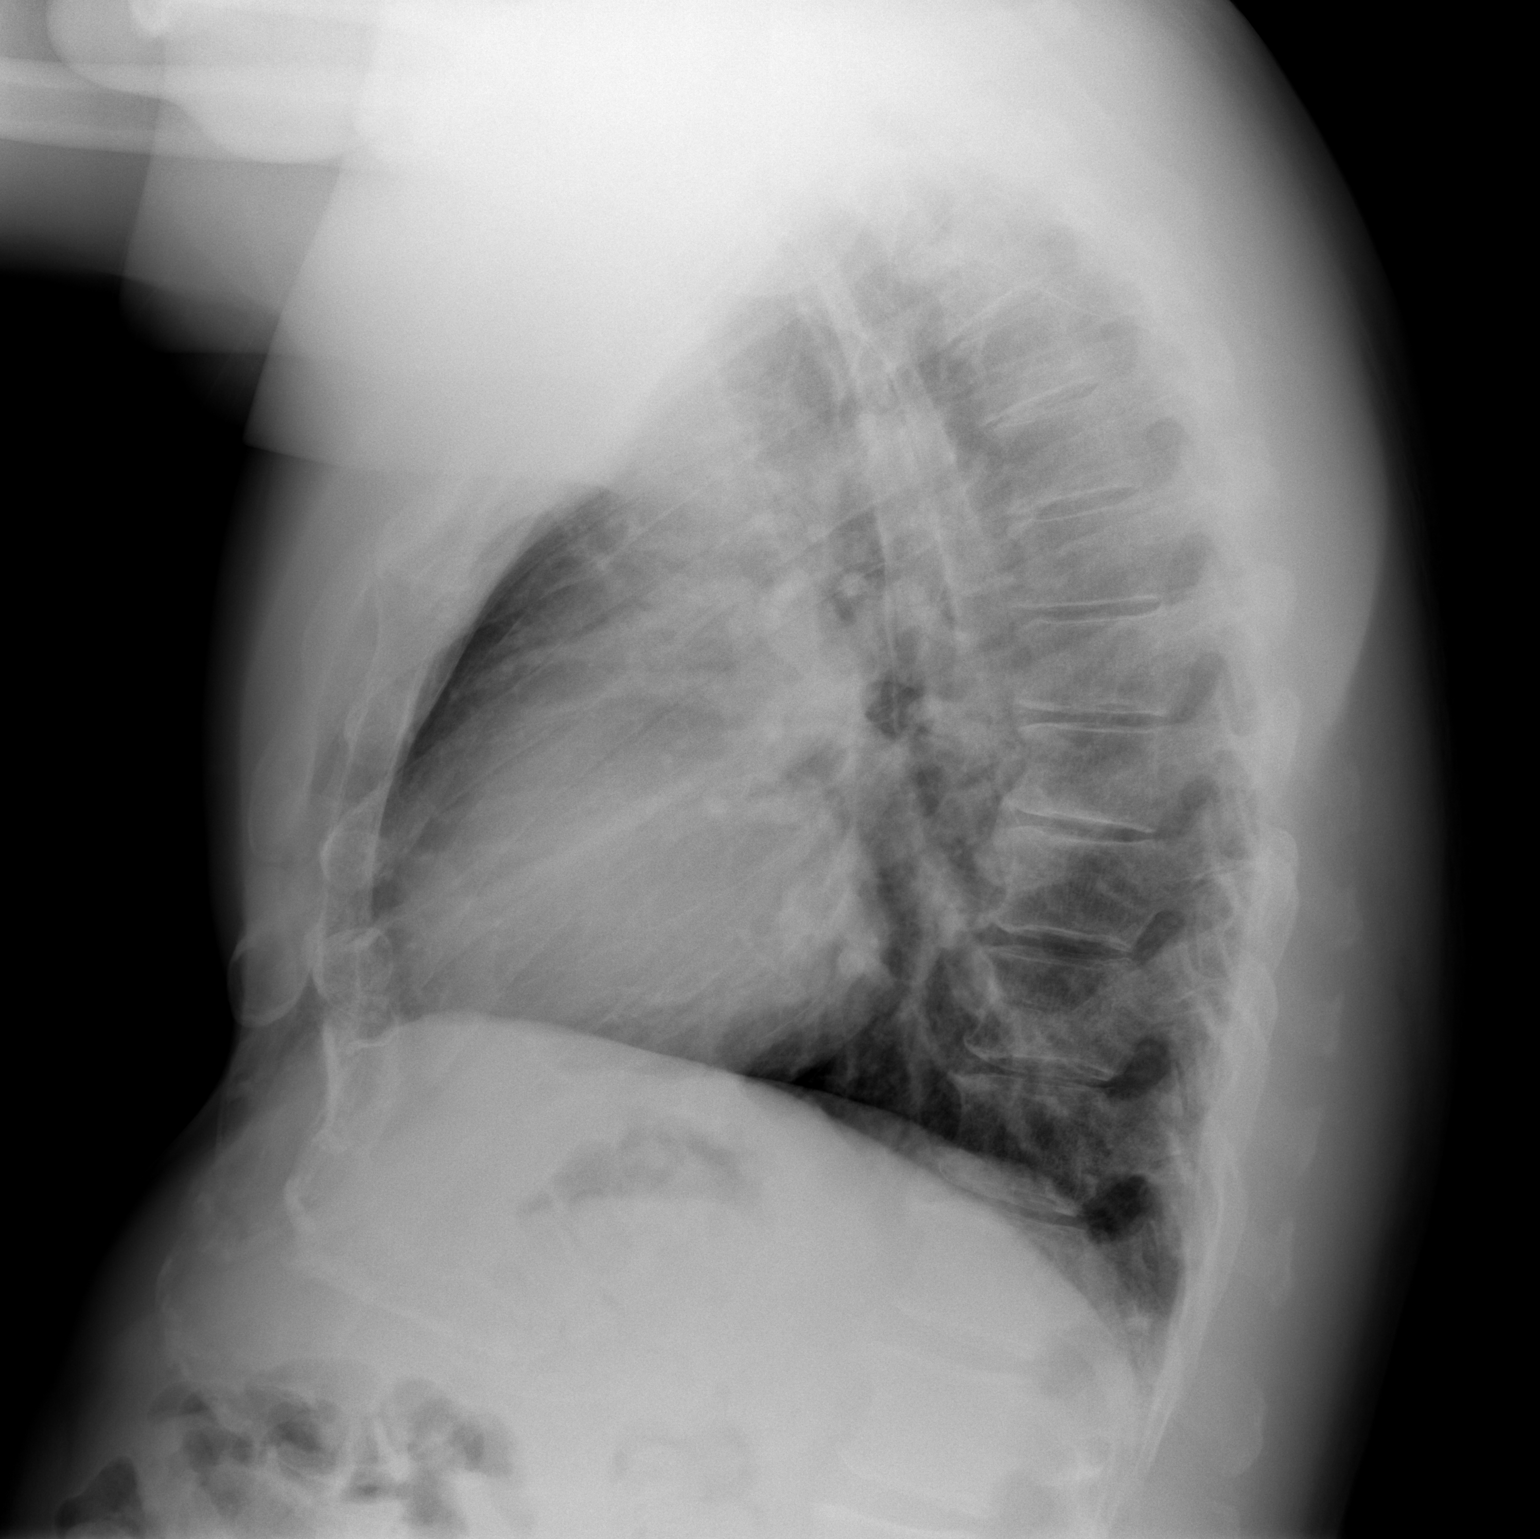

[2 of 2 positions shown; findings below may reference images not displayed]

FINDINGS: Heart size and mediastinal contours are within normal limits. Lungs
are clear. No pleural effusion or pneumothorax is seen. Mild
degenerative spondylosis of the thoracic spine. No acute or
suspicious osseous finding.
IMPRESSION: No active cardiopulmonary disease. No evidence of pneumonia or
pulmonary edema.

## 2022-08-25 IMAGING — CT CT ANGIO CHEST
1 of 5 series · 17 of 31 positions shown · IV contrast (omnipaque)
Comparison: None.
COMPARISON: None.

Addendum:
CLINICAL DATA: Mid chest pain starting 12 hours ago. Elevated
D-dimer.

EXAM:
CT ANGIOGRAPHY CHEST WITH CONTRAST
TECHNIQUE: Multidetector CT imaging of the chest was performed using the
standard protocol during bolus administration of intravenous
contrast. Multiplanar CT image reconstructions and MIPs were
obtained to evaluate the vascular anatomy.
CONTRAST:  89mL OMNIPAQUE IOHEXOL 350 MG/ML SOLN

[Series 6: pe thins · axial · 0.95mm/px · z∈[-107,+155]mm · 17 of 286 slices shown]
[im 12/286  lung]
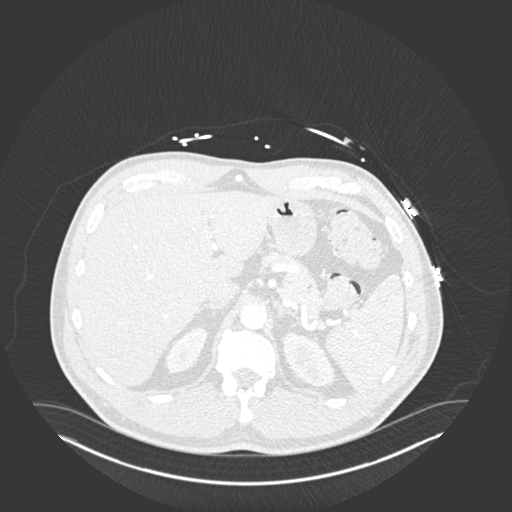
[im 35/286  mediastinal]
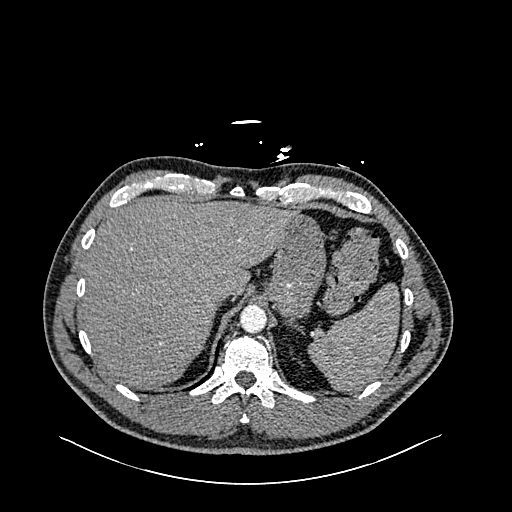
[im 46/286  lung]
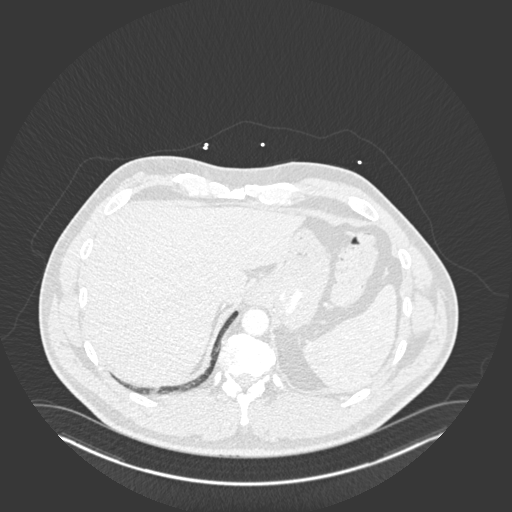
[im 58/286  mediastinal]
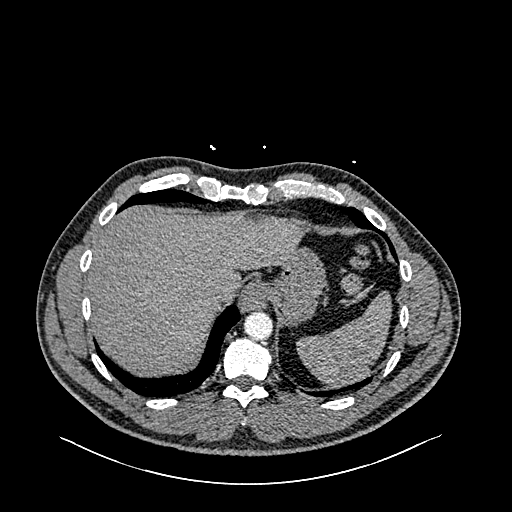
[im 80/286  lung]
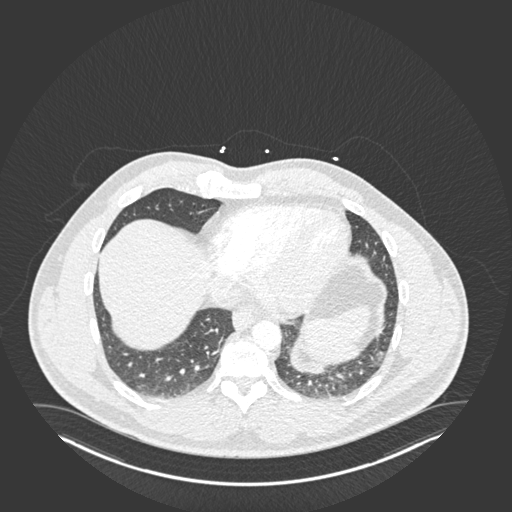
[im 92/286  mediastinal]
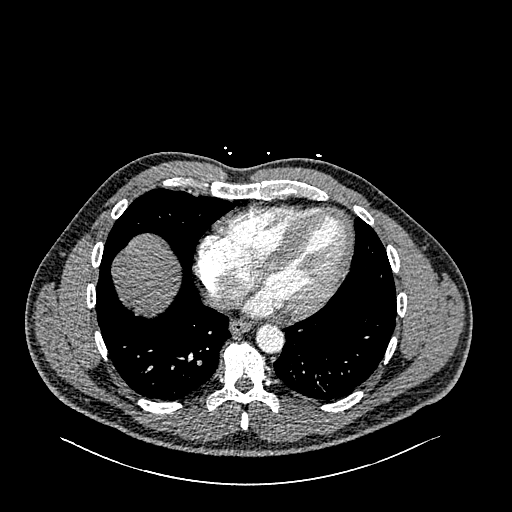
[im 115/286  lung]
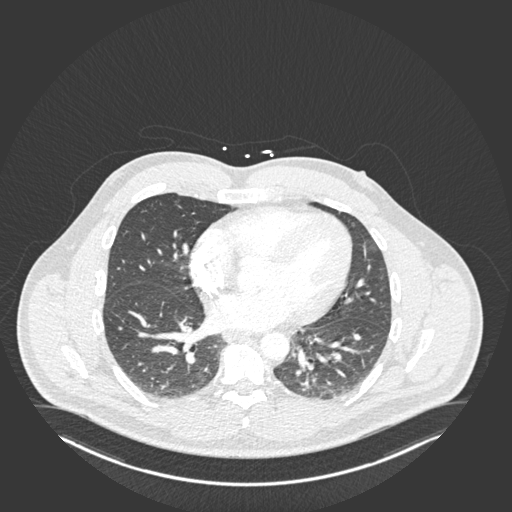
[im 126/286  mediastinal]
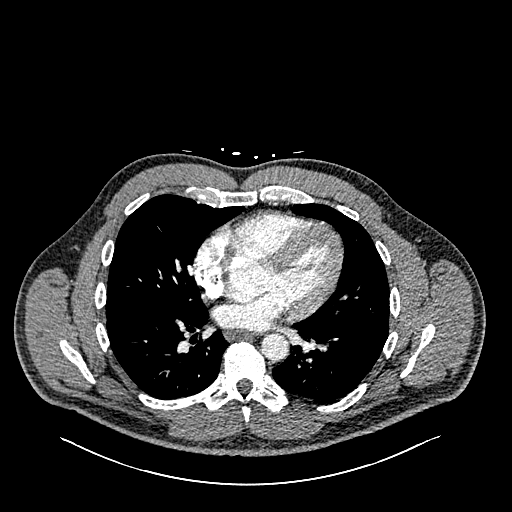
[im 149/286  lung]
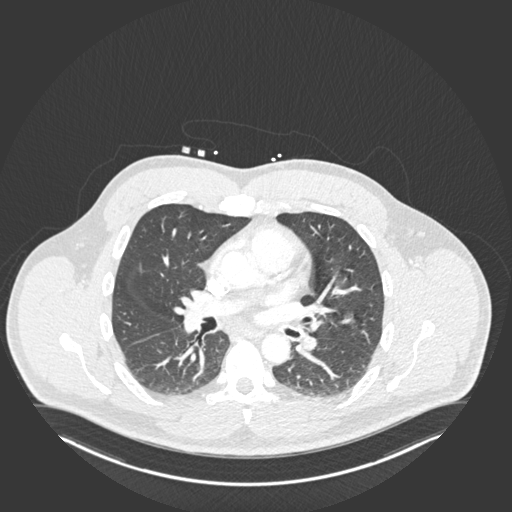
[im 160/286  mediastinal]
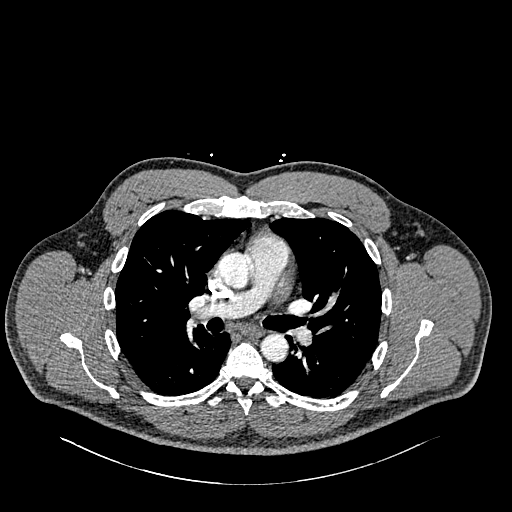
[im 172/286  lung]
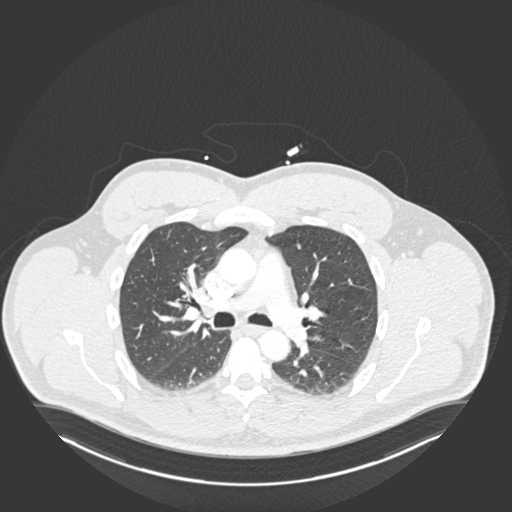
[im 194/286  mediastinal]
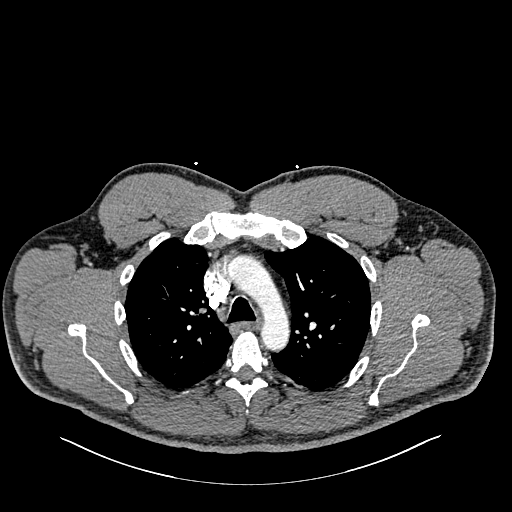
[im 206/286  lung]
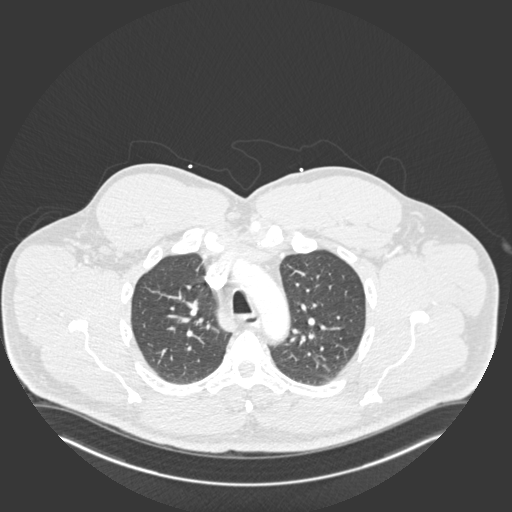
[im 229/286  mediastinal]
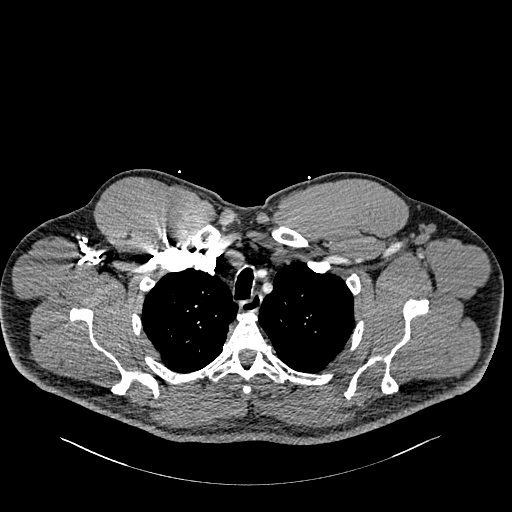
[im 240/286  lung]
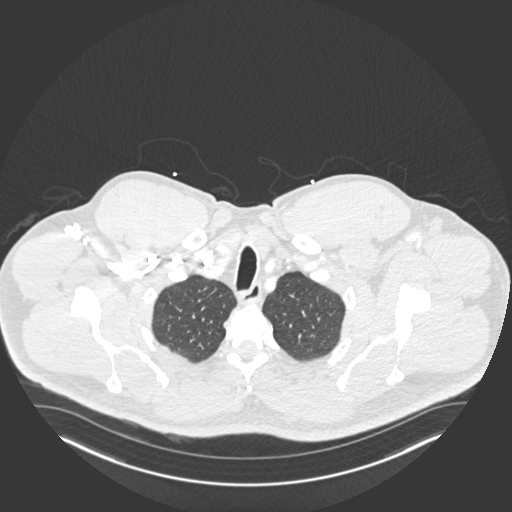
[im 251/286  mediastinal]
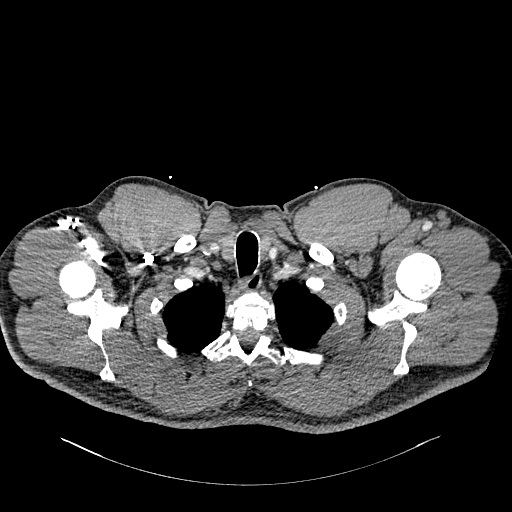
[im 274/286  lung]
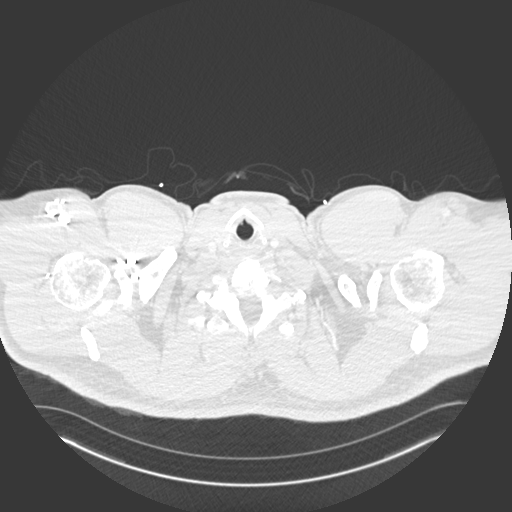

[17 of 31 positions shown; findings below may reference images not displayed]

FINDINGS: Cardiovascular: Suboptimal contrast bolus timing slightly limits
characterization of the bilateral pulmonary artery branches but
there is no pulmonary embolism identified within the main, lobar or
central segmental pulmonary arteries bilaterally.

No thoracic aortic aneurysm or evidence of aortic dissection. Heart
size is within normal limits. No pericardial effusion. Focal
coronary artery calcifications near the junction of the LAD and LEFT
circumflex coronary arteries.

Mediastinum/Nodes: No mass or enlarged lymph nodes are seen within
the mediastinum or perihilar regions. Esophagus is unremarkable.
Trachea and central bronchi are unremarkable.

Lungs/Pleura: Lungs are clear.  No pleural effusion or pneumothorax.

Upper Abdomen: Limited images of the upper abdomen are unremarkable.
Hyperdense material is present within the gastric fundus/cardia, of
uncertain etiology.

Musculoskeletal: Mild degenerative spurring within the kyphotic
thoracic spine. No acute appearing osseous abnormality.

Review of the MIP images confirms the above findings.
IMPRESSION: 1. No acute findings. No pulmonary embolism seen, with mild study
limitations detailed above. Lungs are clear.
2. Coronary artery calcification.
3. Hyperdense material within the gastric fundus/cardia, of
uncertain etiology, presumably due to recently ingested material,
but active hemorrhage within the stomach cannot be excluded. Has
patient recently ingested Pepto-Bismol or other dense material?

ADDENDUM:
Ordering physician tells me that patient recently ingested a GI
cocktail with Maalox containing aluminum hydroxide. This corresponds
to the hyperdense material seen in the stomach.

*** End of Addendum ***
FINDINGS: Cardiovascular: Suboptimal contrast bolus timing slightly limits
characterization of the bilateral pulmonary artery branches but
there is no pulmonary embolism identified within the main, lobar or
central segmental pulmonary arteries bilaterally.

No thoracic aortic aneurysm or evidence of aortic dissection. Heart
size is within normal limits. No pericardial effusion. Focal
coronary artery calcifications near the junction of the LAD and LEFT
circumflex coronary arteries.

Mediastinum/Nodes: No mass or enlarged lymph nodes are seen within
the mediastinum or perihilar regions. Esophagus is unremarkable.
Trachea and central bronchi are unremarkable.

Lungs/Pleura: Lungs are clear.  No pleural effusion or pneumothorax.

Upper Abdomen: Limited images of the upper abdomen are unremarkable.
Hyperdense material is present within the gastric fundus/cardia, of
uncertain etiology.

Musculoskeletal: Mild degenerative spurring within the kyphotic
thoracic spine. No acute appearing osseous abnormality.

Review of the MIP images confirms the above findings.
IMPRESSION: 1. No acute findings. No pulmonary embolism seen, with mild study
limitations detailed above. Lungs are clear.
2. Coronary artery calcification.
3. Hyperdense material within the gastric fundus/cardia, of
uncertain etiology, presumably due to recently ingested material,
but active hemorrhage within the stomach cannot be excluded. Has
patient recently ingested Pepto-Bismol or other dense material?

## 2023-10-15 ENCOUNTER — Ambulatory Visit (INDEPENDENT_AMBULATORY_CARE_PROVIDER_SITE_OTHER)

## 2023-10-15 ENCOUNTER — Ambulatory Visit: Admission: EM | Admit: 2023-10-15 | Discharge: 2023-10-15 | Disposition: A | Discharge status: Home / Self Care

## 2023-10-15 DIAGNOSIS — M545 Low back pain, unspecified: Secondary | ICD-10-CM

## 2023-10-15 MED ORDER — CYCLOBENZAPRINE HCL 10 MG PO TABS: 10.0000 mg | ORAL_TABLET | Freq: Three times a day (TID) | ORAL | 0 refills | Status: AC | PRN

## 2023-10-15 NOTE — ED Provider Notes (Signed)
 [facility]    CSN: [id number] Arrival date & time: 10/15/23  9085      History   Chief Complaint Chief Complaint  Patient presents with   Back Pain    HPI Levi Clark is a 58 y.o. male.   58 year old male presents urgent care with complaints of left-sided back pain that started this morning.  He reports he was putting his socks on the right side and felt a shooting pain up and down the left side of his back.  He denies any pain extending into the lower extremity.  He reports that sitting and moving into a laying position are very painful.  He is having to use a cane due to the pain in his back.  He does have a history of having degenerative disc disease in the past but has not had any significant issues with it in quite some time.  He denies any bowel or bladder incontinence   Back Pain Associated symptoms: no abdominal pain, no chest pain, no dysuria and no fever     History reviewed. No pertinent past medical history.  There are no active problems to display for this patient.   Past Surgical History:  Procedure Laterality Date   APPENDECTOMY         Home Medications    Prior to Admission medications   Medication Sig Start Date End Date Taking? Authorizing Provider  cyclobenzaprine  (FLEXERIL ) 10 MG tablet Take 1 tablet (10 mg total) by mouth 3 (three) times daily as needed for muscle spasms. 10/15/23  Yes Loreto Loescher A, PA-C  losartan (COZAAR) 50 MG tablet Take 50 mg by mouth daily. 07/26/20  Yes [provider]  tadalafil (CIALIS) 20 MG tablet Take 20 mg by mouth daily as needed for erectile dysfunction. 11/18/21  Yes [provider]  aspirin 325 MG tablet Take 325 mg by mouth daily.      [provider]  buprenorphine (SUBUTEX) 8 MG SUBL SL tablet Place 8 mg under the tongue 3 (three) times daily.    [provider]  carbamide peroxide (DEBROX) 6.5 % OTIC solution Place 5 drops into both ears daily as needed.  07/09/22   Christopher Savannah, PA-C  famotidine  (PEPCID ) 20 MG tablet Take 1 tablet (20 mg total) by mouth daily. 09/08/19   Donah Riis A, PA-C  metoprolol succinate (TOPROL-XL) 50 MG 24 hr tablet Take 50 mg by mouth daily.    [provider]  pantoprazole  (PROTONIX ) 20 MG tablet Take 1 tablet (20 mg total) by mouth daily. 09/08/19   Donah Riis A, PA-C  sucralfate  (CARAFATE ) 1 GM/10ML suspension Take 10 mLs (1 g total) by mouth 4 (four) times daily -  with meals and at bedtime. 09/08/19   Donah Riis LABOR, PA-C    Family History Family History  Problem Relation Age of Onset   Emphysema Mother    CAD Father    Leukemia Father    Lung cancer Father     Social History Social History   Tobacco Use   Smoking status: Never   Smokeless tobacco: Never  Vaping Use   Vaping status: Never Used  Substance Use Topics   Alcohol use: Not Currently   Drug use: Never     Allergies   Other   Review of Systems Review of Systems  Constitutional:  Negative for chills and fever.  HENT:  Negative for ear pain and sore throat.   Eyes:  Negative for pain and visual disturbance.  Respiratory:  Negative for cough and shortness of breath.   Cardiovascular:  Negative for chest pain and palpitations.  Gastrointestinal:  Negative for abdominal pain and vomiting.  Genitourinary:  Negative for dysuria and hematuria.  Musculoskeletal:  Positive for back pain. Negative for arthralgias.  Skin:  Negative for color change and rash.  Neurological:  Negative for seizures and syncope.  All other systems reviewed and are negative.    Physical Exam Triage Vital Signs ED Triage Vitals [10/15/23 0924]  Encounter Vitals Group     BP (!) 142/85     Girls Systolic BP Percentile      Girls Diastolic BP Percentile      Boys Systolic BP Percentile      Boys Diastolic BP Percentile      Pulse Rate 77     Resp 18     Temp 98.6 F (37 C)     Temp Source Oral     SpO2 96 %     Weight      Height       Head Circumference      Peak Flow      Pain Score      Pain Loc      Pain Education      Exclude from Growth Chart    No data found.  Updated Vital Signs BP (!) 142/85 (BP Location: Right Arm)   Pulse 77   Temp 98.6 F (37 C) (Oral)   Resp 18   SpO2 96%   Visual Acuity Right Eye Distance:   Left Eye Distance:   Bilateral Distance:    Right Eye Near:   Left Eye Near:    Bilateral Near:     Physical Exam Vitals and nursing note reviewed.  Constitutional:      General: He is not in acute distress.    Appearance: He is well-developed.  HENT:     Head: Normocephalic and atraumatic.  Eyes:     Conjunctiva/sclera: Conjunctivae normal.  Cardiovascular:     Rate and Rhythm: Normal rate and regular rhythm.     Heart sounds: No murmur heard. Pulmonary:     Effort: Pulmonary effort is normal. No respiratory distress.     Breath sounds: Normal breath sounds.  Abdominal:     Palpations: Abdomen is soft.     Tenderness: There is no abdominal tenderness.  Musculoskeletal:        General: No swelling.     Cervical back: Neck supple.     Lumbar back: Spasms and tenderness (Tenderness to palpation along the left lumbar) present. No swelling or deformity. Decreased range of motion (Difficulty with turning and lying down). Negative right straight leg raise test and negative left straight leg raise test.       Back:  Skin:    General: Skin is warm and dry.     Capillary Refill: Capillary refill takes less than 2 seconds.  Neurological:     Mental Status: He is alert.  Psychiatric:        Mood and Affect: Mood normal.      UC Treatments / Results  Labs (all labs ordered are listed, but only abnormal results are displayed) Labs Reviewed - No data to display  EKG   Radiology DG Lumbar Spine Complete Result Date: 10/15/2023 CLINICAL DATA:  Left-sided back pain. EXAM: LUMBAR SPINE - COMPLETE 4+ VIEW COMPARISON:  None Available. FINDINGS: There is no evidence of  lumbar spine fracture. Alignment is normal. There  is mild anterior osteophyte formation at the levels of L2-L3 and L3-L4 with moderate severity left-sided lateral osteophyte formation at the levels of L1-L2 and L3-L4. Mild multilevel intervertebral disc space narrowing is noted, slightly more prominent at the level of L3-L4. IMPRESSION: Mild to moderate severity multilevel degenerative changes, as described above. Electronically Signed   By: Suzen Dials M.D.   On: 10/15/2023 10:15    Procedures Procedures (including critical care time)  Medications Ordered in UC Medications - No data to display  Initial Impression / Assessment and Plan / UC Course  I have reviewed the triage vital signs and the nursing notes.  Pertinent labs & imaging results that were available during my care of the patient were reviewed by me and considered in my medical decision making (see chart for details).     Acute left-sided low back pain without sciatica - Plan: DG Lumbar Spine Complete, DG Lumbar Spine Complete   X-ray done of the lumbar spine.  Final evaluation by the radiologist shows moderate degenerative changes but no acute changes.  Symptoms and physical exam findings along with the x-ray is most suggestive of a muscle strain in the lower back with muscle spasm.  We can treat with the following: Flexeril  10 mg every 8 hours as needed for muscle spasms.  Use caution as this medication can cause drowsiness. May use ibuprofen  600 to 800 mg every 6-8 hours as needed for pain. Light stretching to improve mobility May use moist heat to help with symptoms If you do not have significant improvement in your symptoms in the next 5 to 7 days then may want to consider following up with orthopedics  Final Clinical Impressions(s) / UC Diagnoses   Final diagnoses:  Acute left-sided low back pain without sciatica     Discharge Instructions      X-ray done of the lumbar spine.  Final evaluation by the  radiologist shows moderate degenerative changes but no acute changes.  Symptoms and physical exam findings along with the x-ray is most suggestive of a muscle strain in the lower back with muscle spasm.  We can treat with the following: Flexeril  10 mg every 8 hours as needed for muscle spasms.  Use caution as this medication can cause drowsiness. May use ibuprofen  600 to 800 mg every 6-8 hours as needed for pain. Light stretching to improve mobility May use moist heat to help with symptoms If you do not have significant improvement in your symptoms in the next 5 to 7 days then may want to consider following up with orthopedics     ED Prescriptions     Medication Sig Dispense Auth. Provider   cyclobenzaprine  (FLEXERIL ) 10 MG tablet Take 1 tablet (10 mg total) by mouth 3 (three) times daily as needed for muscle spasms. 20 tablet Teresa Almarie LABOR, [place]      PDMP not reviewed this encounter.   Teresa Almarie LABOR, PA-C 10/15/23 1037

## 2023-10-15 NOTE — Discharge Instructions (Addendum)
 X-ray done of the lumbar spine.  Final evaluation by the radiologist shows moderate degenerative changes but no acute changes.  Symptoms and physical exam findings along with the x-ray is most suggestive of a muscle strain in the lower back with muscle spasm.  We can treat with the following: Flexeril  10 mg every 8 hours as needed for muscle spasms.  Use caution as this medication can cause drowsiness. May use ibuprofen  600 to 800 mg every 6-8 hours as needed for pain. Light stretching to improve mobility May use moist heat to help with symptoms If you do not have significant improvement in your symptoms in the next 5 to 7 days then may want to consider following up with orthopedics

## 2023-10-15 NOTE — ED Triage Notes (Signed)
 Pt reports left sided low back pain since this morning after he was putting his socks on and felt a shooting pain. Pain is worse when p[t try to sit or stand up. Pt took ibuprofen , cane and is using a back brace.   Pt states he can not have any pain medication.

## 2024-01-17 ENCOUNTER — Ambulatory Visit
Admission: EM | Admit: 2024-01-17 | Discharge: 2024-01-17 | Disposition: A | Discharge status: Home / Self Care | Attending: Family Medicine | Admitting: Family Medicine

## 2024-01-17 DIAGNOSIS — H109 Unspecified conjunctivitis: Secondary | ICD-10-CM | POA: Diagnosis not present

## 2024-01-17 MED ORDER — TOBRAMYCIN 0.3 % OP SOLN: 1.0000 [drp] | OPHTHALMIC | 0 refills | Status: AC

## 2024-01-17 NOTE — ED Triage Notes (Signed)
 Pt c/o redness to right eye 3 days ago-denies injury or known FB-has been using OTC eye drops/visine-NAD-steady gait

## 2024-01-17 NOTE — ED Provider Notes (Signed)
 " [facility]  Note:  This document was prepared using Conservation officer, historic buildings and may include unintentional dictation errors.  MRN: [id number] DOB: [birth year]  Subjective:   Levi Clark is a 59 y.o. male presenting for 3 day history of right eye redness and irritation.  Initially felt foreign body sensation but then resolved.  Continues to have the irritation however.  No vision changes, eye pain.  No eye trauma.  No contact lens use.  No history of glaucoma.  Current Outpatient Medications  Medication Instructions   aspirin 325 mg, Daily   buprenorphine (SUBUTEX) 8 mg, Sublingual, 3 times daily   carbamide peroxide (DEBROX) 6.5 % OTIC solution 5 drops, Both EARS, Daily PRN   cyclobenzaprine  (FLEXERIL ) 10 mg, Oral, 3 times daily PRN   famotidine  (PEPCID ) 20 mg, Oral, Daily   losartan (COZAAR) 50 mg, Daily   metoprolol succinate (TOPROL-XL) 50 mg, Oral, Daily   pantoprazole  (PROTONIX ) 20 mg, Oral, Daily   sucralfate  (CARAFATE ) 1 g, Oral, 3 times daily with meals & bedtime   tadalafil (CIALIS) 20 mg, Daily PRN    Allergies[1]  No past medical history on file.   Past Surgical History:  Procedure Laterality Date   APPENDECTOMY      Family History  Problem Relation Age of Onset   Emphysema Mother    CAD Father    Leukemia Father    Lung cancer Father     Social History   Occupational History   Not on file  Tobacco Use   Smoking status: Never   Smokeless tobacco: Never  Vaping Use   Vaping status: Never Used  Substance and Sexual Activity   Alcohol use: Not Currently   Drug use: Never   Sexual activity: Yes     ROS   Objective:   Vitals: There were no vitals taken for this visit.  Physical Exam Constitutional:      General: He is not in acute distress.    Appearance: Normal appearance. He is well-developed and normal weight. He is not ill-appearing, toxic-appearing or diaphoretic.  HENT:     Head: Normocephalic and  atraumatic.     Right Ear: External ear normal.     Left Ear: External ear normal.     Nose: Nose normal.     Mouth/Throat:     Pharynx: Oropharynx is clear.  Eyes:     General: Lids are everted, no foreign bodies appreciated. No scleral icterus.       Right eye: No foreign body, discharge or hordeolum.        Left eye: No foreign body, discharge or hordeolum.     Extraocular Movements: Extraocular movements intact.     Conjunctiva/sclera:     Right eye: Right conjunctiva is injected. No chemosis, exudate or hemorrhage.    Left eye: Left conjunctiva is not injected. No chemosis, exudate or hemorrhage. Cardiovascular:     Rate and Rhythm: Normal rate.  Pulmonary:     Effort: Pulmonary effort is normal.  Musculoskeletal:     Cervical back: Normal range of motion.  Neurological:     Mental Status: He is alert and oriented to person, place, and time.  Psychiatric:        Mood and Affect: Mood normal.        Behavior: Behavior normal.        Thought Content: Thought content normal.        Judgment: Judgment normal.    Eye Exam:  Eyelids everted and swept for foreign body. The eye was stained with fluorescein. Examination under woods lamp does not reveal a foreign body or area of increased stain uptake. The eye was then irrigated copiously with saline.  Assessment and Plan :   PDMP not reviewed this encounter.  1. Bacterial conjunctivitis of right eye      Will start tobramycin  to address bacterial conjunctivitis of the right eye.  Follow-up with his ophthalmologist.  Counseled patient on potential for adverse effects with medications prescribed/recommended today, ER and return-to-clinic precautions discussed, patient verbalized understanding.     [1]  Allergies Allergen Reactions   Other Other (See Comments)     Christopher Savannah, PA-C 01/17/24 1421  "
